# Patient Record
Sex: Male | Born: 1985 | Race: Asian | Hispanic: No | Marital: Single | Smoking: Current every day smoker
Health system: Southern US, Community
[De-identification: ages and names within clinical notes are randomized; demographics above are authoritative.]

---

## 2005-02-18 ENCOUNTER — Ambulatory Visit: Payer: Self-pay | Admitting: Internal Medicine

## 2005-05-28 ENCOUNTER — Ambulatory Visit: Payer: Self-pay | Admitting: Gastroenterology

## 2005-06-13 ENCOUNTER — Ambulatory Visit: Payer: Self-pay | Admitting: Internal Medicine

## 2005-08-01 ENCOUNTER — Ambulatory Visit (HOSPITAL_COMMUNITY): Admission: RE | Admit: 2005-08-01 | Discharge: 2005-08-01 | Payer: Self-pay | Admitting: Gastroenterology

## 2005-08-29 ENCOUNTER — Ambulatory Visit: Payer: Self-pay | Admitting: Gastroenterology

## 2005-12-10 DIAGNOSIS — B1710 Acute hepatitis C without hepatic coma: Secondary | ICD-10-CM

## 2005-12-10 DIAGNOSIS — B181 Chronic viral hepatitis B without delta-agent: Secondary | ICD-10-CM | POA: Insufficient documentation

## 2006-01-07 DIAGNOSIS — D568 Other thalassemias: Secondary | ICD-10-CM

## 2010-02-11 ENCOUNTER — Encounter: Payer: Self-pay | Admitting: Gastroenterology

## 2014-11-03 ENCOUNTER — Ambulatory Visit: Payer: Worker's Compensation

## 2014-11-03 ENCOUNTER — Ambulatory Visit
Admission: EM | Admit: 2014-11-03 | Discharge: 2014-11-03 | Disposition: A | Discharge status: Home / Self Care | Payer: Worker's Compensation | Attending: Family Medicine | Admitting: Family Medicine

## 2014-11-03 DIAGNOSIS — S60852A Superficial foreign body of left wrist, initial encounter: Secondary | ICD-10-CM | POA: Diagnosis not present

## 2014-11-03 MED ORDER — MUPIROCIN 2 % EX OINT: 1.0000 "application " | TOPICAL_OINTMENT | Freq: Three times a day (TID) | CUTANEOUS | Status: AC

## 2014-11-03 NOTE — Discharge Instructions (Signed)
Sliver Removal, Care After A sliver--also called a splinter--is a small and thin broken piece of an object that gets stuck (embedded) under the skin. A sliver can create a deep wound that can easily become infected. It is important to care for the wound after a sliver is removed to help prevent infection and other problems from developing. WHAT TO EXPECT AFTER THE PROCEDURE Slivers often break into smaller pieces when they are removed. If pieces of your sliver broke off and stayed in your skin, you will eventually see them working themselves out and you may feel some pain at the wound site. This is normal. HOME CARE INSTRUCTIONS  Keep all follow-up visits as directed by your health care provider. This is important.  There are many different ways to close and cover a wound, including stitches (sutures) and adhesive strips. Follow your health care provider's instructions about:  Wound care.  Bandage (dressing) changes and removal.  Wound closure removal.  Check the wound site every day for signs of infection. Watch for:  Red streaks coming from the wound.  Fever.  Redness or tenderness around the wound.  Fluid, blood, or pus coming from the wound.  A bad smell coming from the wound. SEEK MEDICAL CARE IF:  You think that a piece of the sliver is still in your skin.  Your wound was closed, as with sutures, and the edges of the wound break open.  You have signs of infection, including:  New or worsening redness around the wound.  New or worsening tenderness around the wound.  Fluid, blood, or pus coming from the wound.  A bad smell coming from the wound or dressing. SEEK IMMEDIATE MEDICAL CARE IF: You have any of the following signs of infection:  Red streaks coming from the wound.  An unexplained fever.   This information is not intended to replace advice given to you by your health care provider. Make sure you discuss any questions you have with your health care  provider.   Document Released: 01/05/2000 Document Revised: 01/28/2014 Document Reviewed: 09/09/2013 Elsevier Interactive Patient Education 2016 [employer]  

## 2014-11-03 NOTE — ED Notes (Signed)
Interpreter obtained from [employer] [phone]. Operator #  [phone].Marland Kitchen.   Provider was able to ask all questions, and get answers. Pt denied allergies, medical history and past surgeries. Pt also verbalizes to wash area three times a day and to apply antibiotic ointment after cleaning.
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
## 2014-11-03 NOTE — ED Notes (Signed)
Obtaining interpreter via phone.

## 2014-11-05 ENCOUNTER — Ambulatory Visit
Admission: EM | Admit: 2014-11-05 | Discharge: 2014-11-05 | Disposition: A | Discharge status: Home / Self Care | Payer: Self-pay | Attending: Family Medicine | Admitting: Family Medicine

## 2014-11-05 DIAGNOSIS — S61532D Puncture wound without foreign body of left wrist, subsequent encounter: Secondary | ICD-10-CM
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
## 2017-02-25 IMAGING — CR DG WRIST COMPLETE 3+V*L*
3 series · 3 of 3 positions shown · non-contrast
Comparison: None.

CLINICAL DATA: Staple imbedded in wrist

EXAM:
LEFT WRIST - COMPLETE 3+ VIEW

[wrist pa]
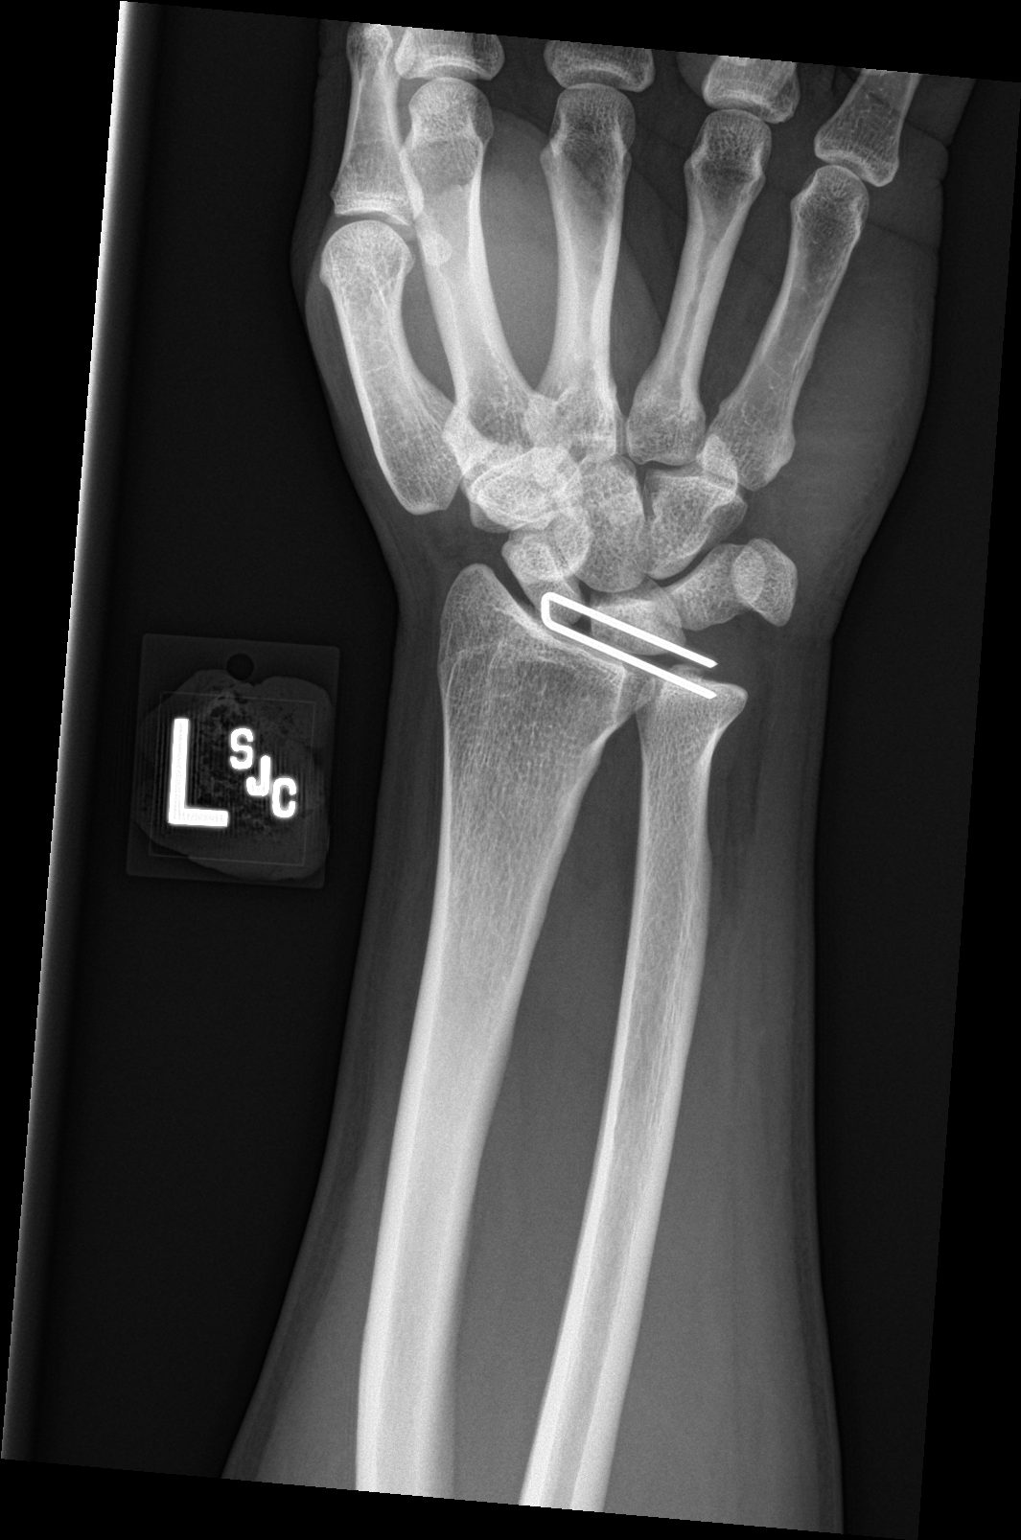

[wrist obl]
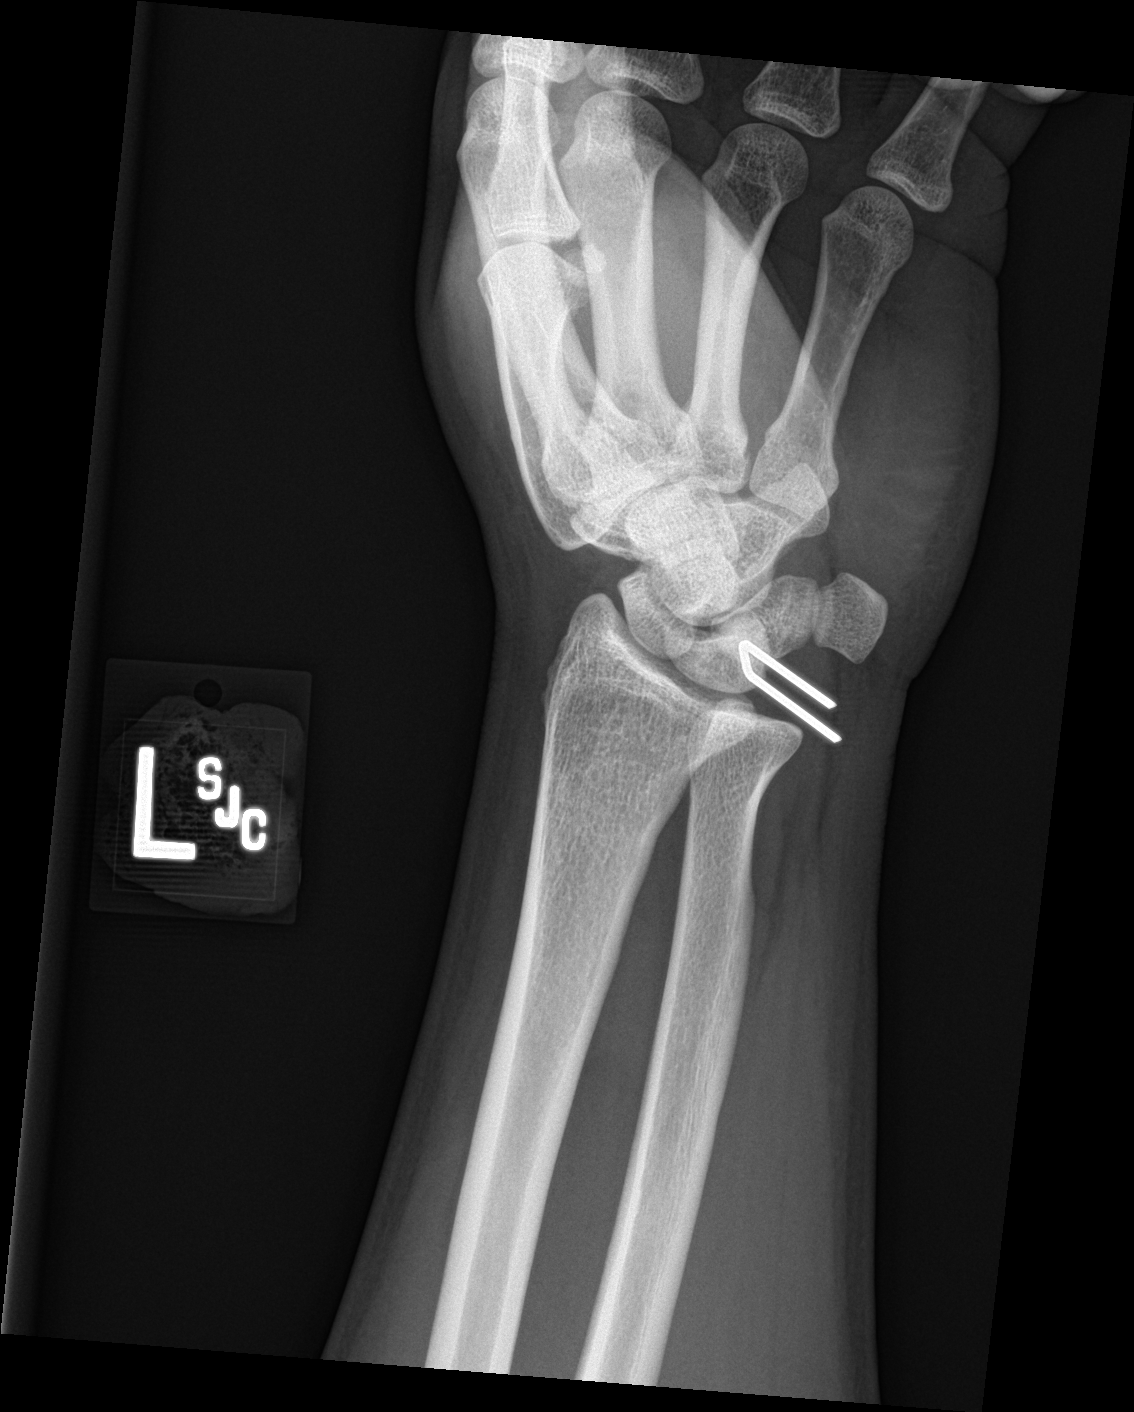

[wrist lat]
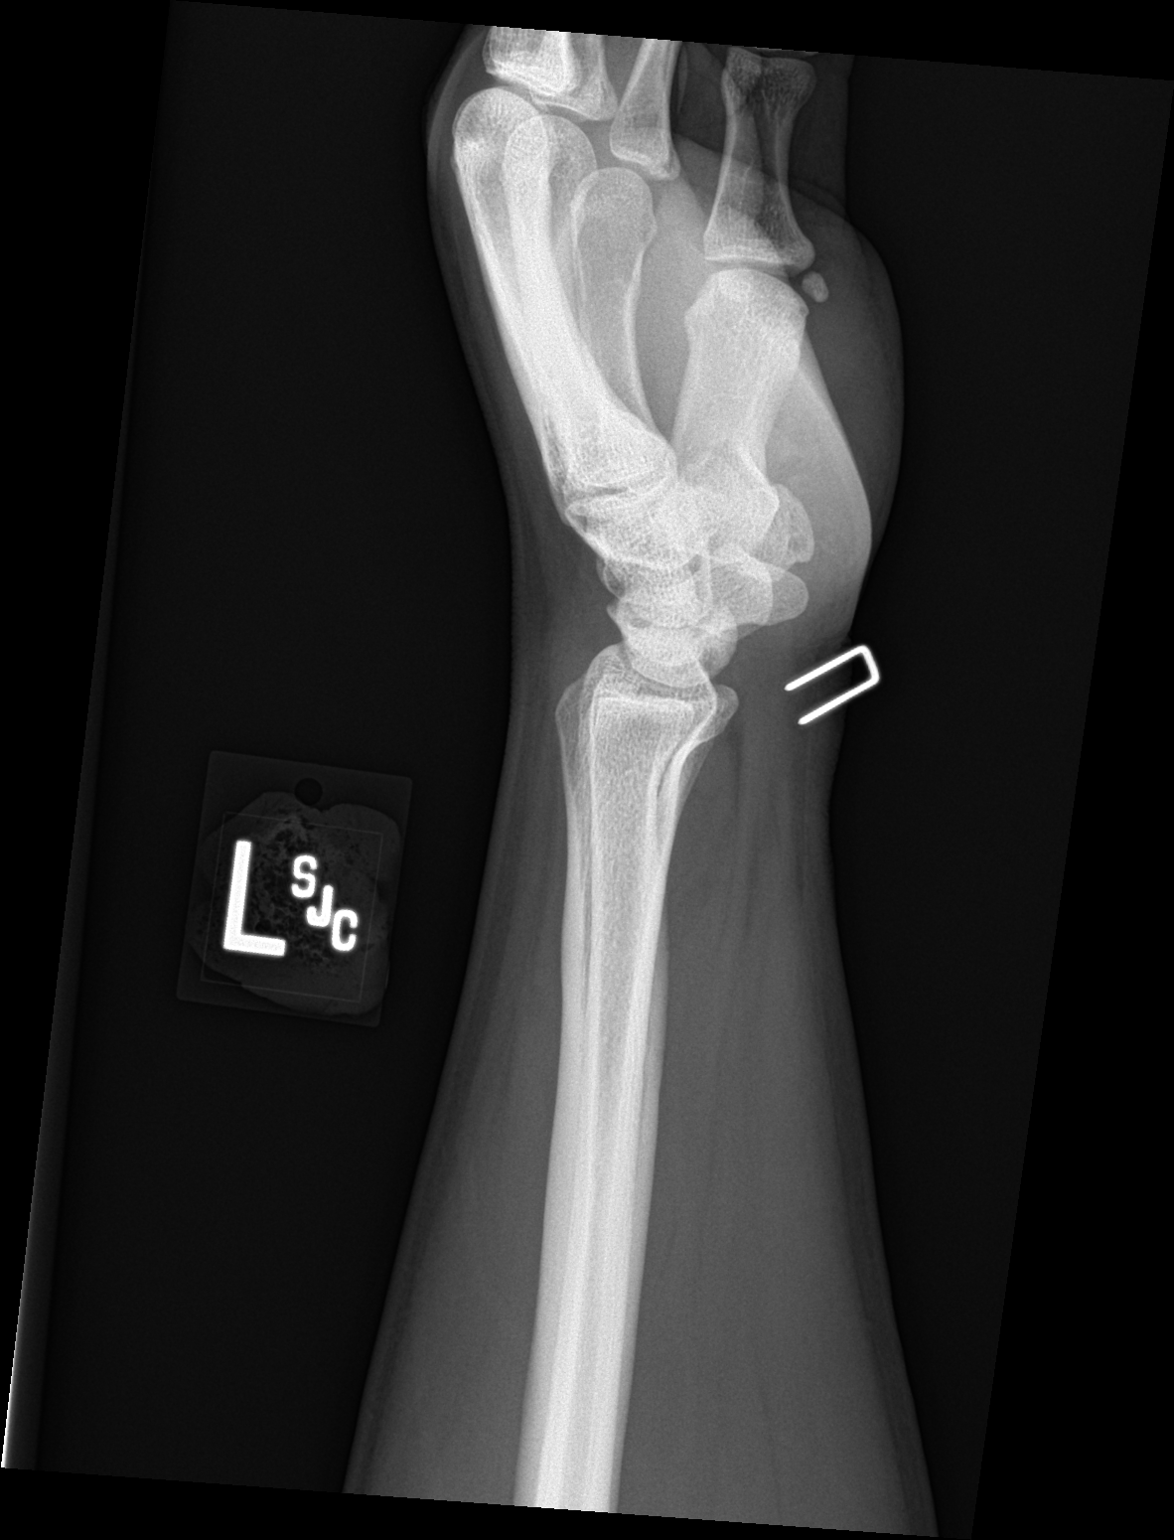

[3 of 3 positions shown; findings below may reference images not displayed]

FINDINGS: Frontal, oblique, and lateral views were obtained. There is a
metallic staple in the soft tissues volar to the radiocarpal joint.
There is no bony abnormality. No fracture or dislocation. Joint
spaces appear intact. No erosive change.
IMPRESSION: Metallic staple in soft tissues volar to the wrist. The horizontal
portion of the staple appears external to the skin surface. No bony
abnormality. Staple does not abut or extend into bone. No fracture
or dislocation. No appreciable arthropathy.
